# Patient Record
Sex: Male | Born: 1945 | Race: White | Hispanic: No | Marital: Married | Smoking: Former smoker
Health system: Southern US, Community
[De-identification: ages and names within clinical notes are randomized; demographics above are authoritative.]

## PROBLEM LIST (undated history)

## (undated) DIAGNOSIS — N400 Enlarged prostate without lower urinary tract symptoms: Secondary | ICD-10-CM

## (undated) DIAGNOSIS — I10 Essential (primary) hypertension: Secondary | ICD-10-CM

## (undated) DIAGNOSIS — J45909 Unspecified asthma, uncomplicated: Secondary | ICD-10-CM

## (undated) DIAGNOSIS — K5730 Diverticulosis of large intestine without perforation or abscess without bleeding: Secondary | ICD-10-CM

## (undated) DIAGNOSIS — I723 Aneurysm of iliac artery: Secondary | ICD-10-CM

## (undated) DIAGNOSIS — I714 Abdominal aortic aneurysm, without rupture, unspecified: Secondary | ICD-10-CM

## (undated) DIAGNOSIS — K219 Gastro-esophageal reflux disease without esophagitis: Secondary | ICD-10-CM

## (undated) HISTORY — DX: Abdominal aortic aneurysm, without rupture, unspecified: I71.40

## (undated) HISTORY — DX: Benign prostatic hyperplasia without lower urinary tract symptoms: N40.0

## (undated) HISTORY — DX: Essential (primary) hypertension: I10

## (undated) HISTORY — DX: Aneurysm of iliac artery: I72.3

## (undated) HISTORY — DX: Diverticulosis of large intestine without perforation or abscess without bleeding: K57.30

## (undated) HISTORY — DX: Gastro-esophageal reflux disease without esophagitis: K21.9

## (undated) HISTORY — DX: Unspecified asthma, uncomplicated: J45.909

## (undated) HISTORY — DX: Abdominal aortic aneurysm, without rupture: I71.4

---

## 2004-08-23 ENCOUNTER — Encounter: Admission: RE | Admit: 2004-08-23 | Discharge: 2004-08-23 | Payer: Self-pay | Admitting: Occupational Medicine

## 2006-02-13 ENCOUNTER — Encounter: Admission: RE | Admit: 2006-02-13 | Discharge: 2006-02-13 | Payer: Self-pay | Admitting: Gastroenterology

## 2007-03-08 ENCOUNTER — Emergency Department (HOSPITAL_COMMUNITY): Admission: EM | Admit: 2007-03-08 | Discharge: 2007-03-08 | Payer: Self-pay | Admitting: Emergency Medicine

## 2008-10-29 ENCOUNTER — Emergency Department (HOSPITAL_BASED_OUTPATIENT_CLINIC_OR_DEPARTMENT_OTHER): Admission: EM | Admit: 2008-10-29 | Discharge: 2008-10-29 | Payer: Self-pay | Admitting: Emergency Medicine

## 2008-10-29 ENCOUNTER — Ambulatory Visit: Payer: Self-pay | Admitting: Diagnostic Radiology

## 2009-09-28 IMAGING — CR DG CHEST 2V
2 series · 2 of 2 positions shown · non-contrast
Comparison: Chest radiograph 03/08/2007

CLINICAL DATA: Dizziness

CHEST - 2 VIEW

[w chest pa]
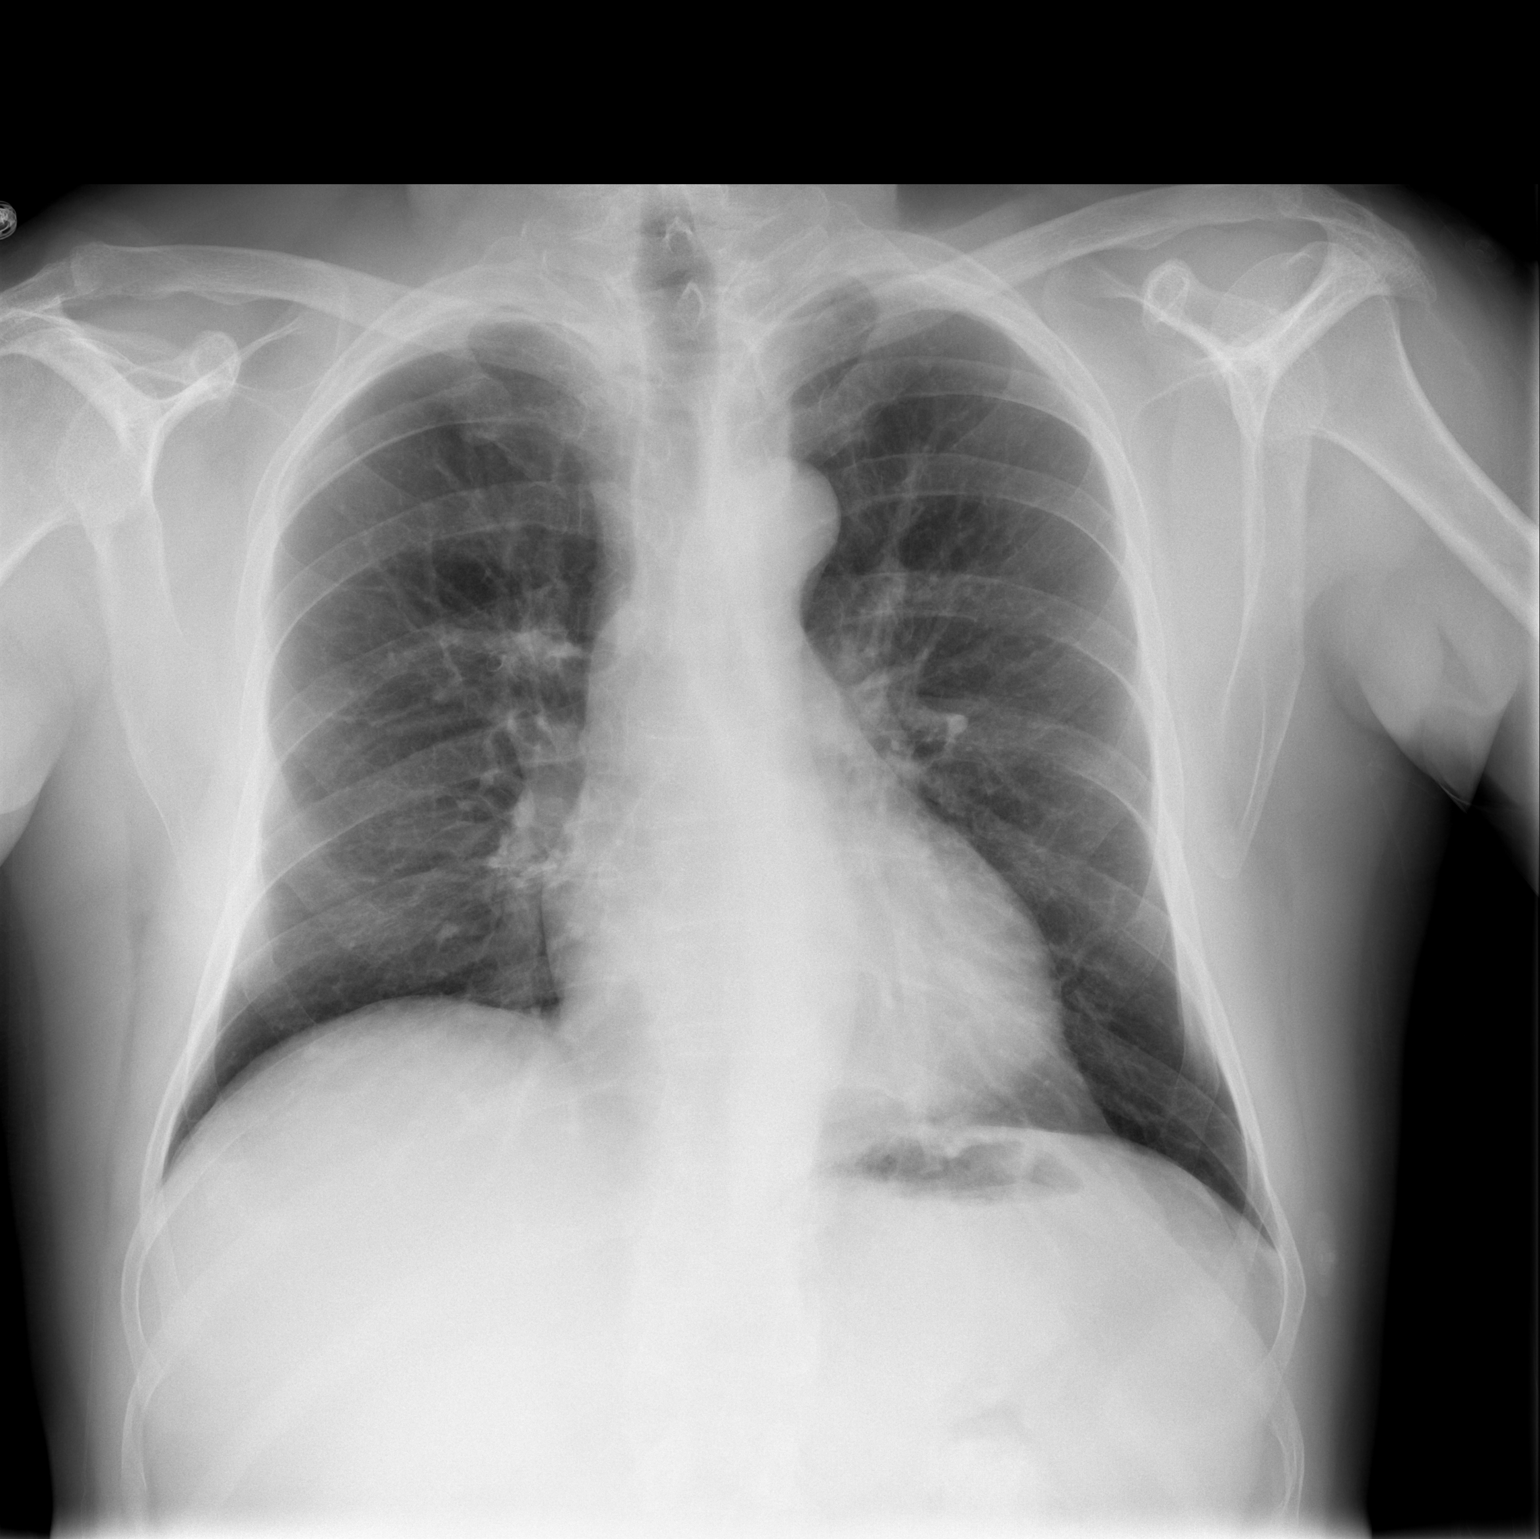

[w chest lat]
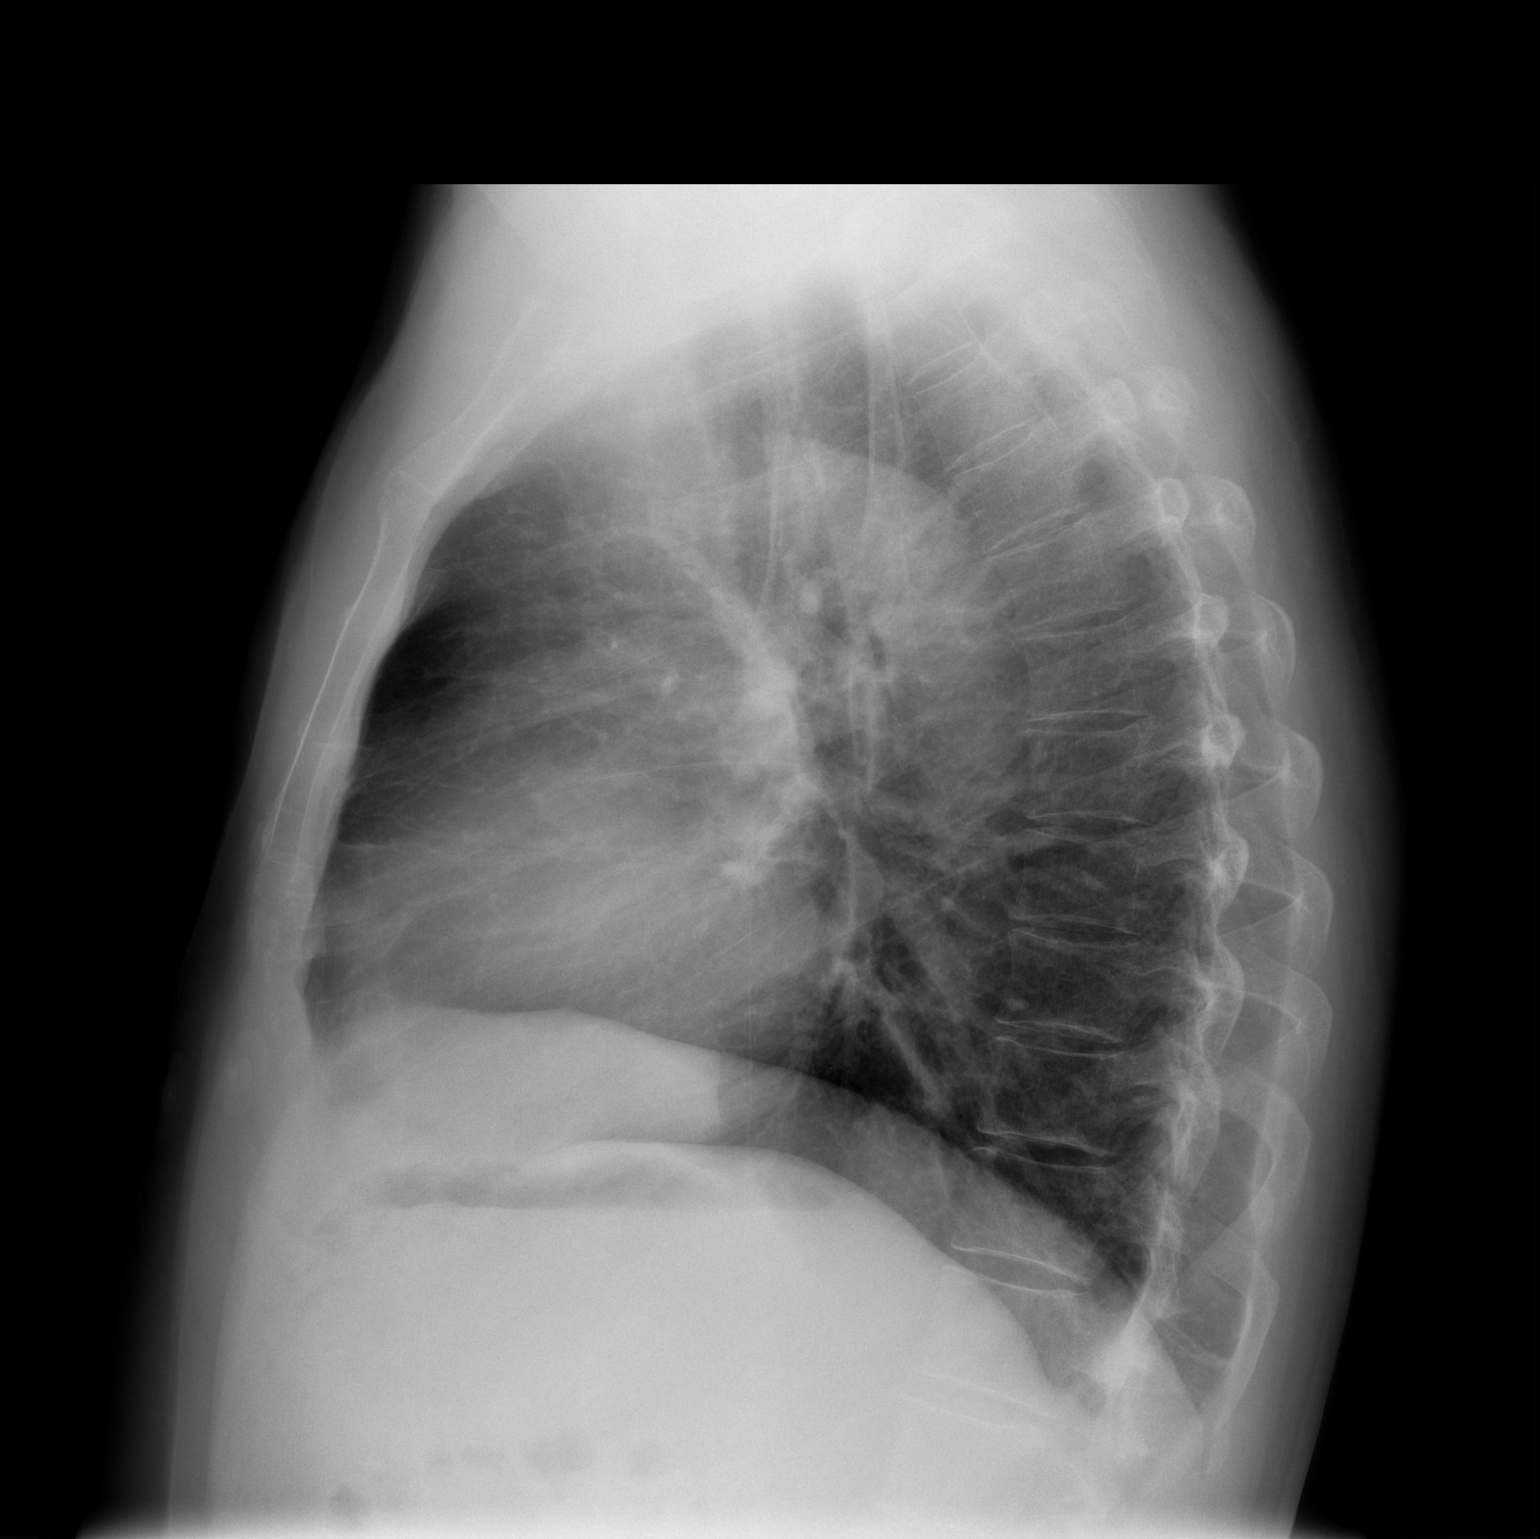

[2 of 2 positions shown; findings below may reference images not displayed]

FINDINGS: Normal mediastinum and cardiac silhouette.  Costophrenic
angles are clear.  No evidence of effusion, infiltrate, or
pneumothorax.
IMPRESSION: No acute cardiopulmonary process.

## 2010-12-24 LAB — DIFFERENTIAL
Basophils Absolute: 0 10*3/uL (ref 0.0–0.1)
Basophils Relative: 1 % (ref 0–1)
Eosinophils Absolute: 0.2 10*3/uL (ref 0.0–0.7)
Eosinophils Relative: 3 % (ref 0–5)
Lymphocytes Relative: 14 % (ref 12–46)
Lymphs Abs: 1.1 10*3/uL (ref 0.7–4.0)
Monocytes Absolute: 0.5 10*3/uL (ref 0.1–1.0)
Monocytes Relative: 6 % (ref 3–12)
Neutro Abs: 6 10*3/uL (ref 1.7–7.7)
Neutrophils Relative %: 77 % (ref 43–77)

## 2010-12-24 LAB — POCT TOXICOLOGY PANEL

## 2010-12-24 LAB — URINALYSIS, ROUTINE W REFLEX MICROSCOPIC
Bilirubin Urine: NEGATIVE
Glucose, UA: NEGATIVE mg/dL
Hgb urine dipstick: NEGATIVE
Ketones, ur: NEGATIVE mg/dL
Nitrite: NEGATIVE
Protein, ur: NEGATIVE mg/dL
Specific Gravity, Urine: 1.025 (ref 1.005–1.030)
Urobilinogen, UA: 1 mg/dL (ref 0.0–1.0)
pH: 5.5 (ref 5.0–8.0)

## 2010-12-24 LAB — POCT CARDIAC MARKERS
CKMB, poc: 1 ng/mL (ref 1.0–8.0)
Myoglobin, poc: 65.7 ng/mL (ref 12–200)
Troponin i, poc: <0.05 ng/mL (ref 0.00–0.09)

## 2010-12-24 LAB — CBC
HCT: 45.8 % (ref 39.0–52.0)
Hemoglobin: 14.9 g/dL (ref 13.0–17.0)
MCHC: 32.6 g/dL (ref 30.0–36.0)
MCV: 85.2 fL (ref 78.0–100.0)
Platelets: 178 10*3/uL (ref 150–400)
RBC: 5.38 MIL/uL (ref 4.22–5.81)
RDW: 11.9 % (ref 11.5–15.5)
WBC: 7.8 10*3/uL (ref 4.0–10.5)

## 2010-12-24 LAB — COMPREHENSIVE METABOLIC PANEL
ALT: 45 U/L (ref 0–53)
AST: 32 U/L (ref 0–37)
Albumin: 4.3 g/dL (ref 3.5–5.2)
Alkaline Phosphatase: 95 U/L (ref 39–117)
BUN: 20 mg/dL (ref 6–23)
CO2: 25 mEq/L (ref 19–32)
Calcium: 9 mg/dL (ref 8.4–10.5)
Chloride: 107 mEq/L (ref 96–112)
Creatinine, Ser: 1 mg/dL (ref 0.4–1.5)
GFR calc Af Amer: >60 mL/min (ref >60)
GFR calc non Af Amer: >60 mL/min (ref >60)
Glucose, Bld: 143 mg/dL — ABNORMAL HIGH (ref 70–99)
Potassium: 3.8 mEq/L (ref 3.5–5.1)
Sodium: 142 mEq/L (ref 135–145)
Total Bilirubin: 1.3 mg/dL — ABNORMAL HIGH (ref 0.3–1.2)
Total Protein: 7.3 g/dL (ref 6.0–8.3)

## 2011-06-25 LAB — I-STAT 8, (EC8 V) (CONVERTED LAB)
Acid-base deficit: 3 — ABNORMAL HIGH
BUN: 11
Bicarbonate: 22.6
Chloride: 107
Glucose, Bld: 128 — ABNORMAL HIGH
HCT: 45
Hemoglobin: 15.3
Operator id: 288331
Potassium: 3.7
Sodium: 138
TCO2: 24
pCO2, Ven: 43.3 — ABNORMAL LOW
pH, Ven: 7.327 — ABNORMAL HIGH

## 2011-06-25 LAB — COMPREHENSIVE METABOLIC PANEL
ALT: 22
AST: 19
Albumin: 3.7
Alkaline Phosphatase: 75
BUN: 11
CO2: 24
Calcium: 9.1
Chloride: 108
Creatinine, Ser: 0.7
GFR calc Af Amer: >60
GFR calc non Af Amer: >60
Glucose, Bld: 134 — ABNORMAL HIGH
Potassium: 3.7
Sodium: 138
Total Bilirubin: 1.4 — ABNORMAL HIGH
Total Protein: 6.7

## 2011-06-25 LAB — URINALYSIS, ROUTINE W REFLEX MICROSCOPIC
Bilirubin Urine: NEGATIVE
Glucose, UA: NEGATIVE
Hgb urine dipstick: NEGATIVE
Ketones, ur: NEGATIVE
Nitrite: NEGATIVE
Protein, ur: NEGATIVE
Specific Gravity, Urine: >1.03 — ABNORMAL HIGH
Urobilinogen, UA: 0.2
pH: 6

## 2011-06-25 LAB — LIPASE, BLOOD: Lipase: 28

## 2011-06-25 LAB — POCT CARDIAC MARKERS
CKMB, poc: 1.1
CKMB, poc: 1.1
CKMB, poc: <1 — ABNORMAL LOW
Myoglobin, poc: 49.1
Myoglobin, poc: 76.3
Myoglobin, poc: 81.6
Operator id: 151321
Operator id: 151321
Operator id: 288331
Troponin i, poc: <0.05
Troponin i, poc: <0.05
Troponin i, poc: <0.05

## 2011-06-25 LAB — CBC
HCT: 43.9
Hemoglobin: 15
MCHC: 34.1
MCV: 85
Platelets: 213
RBC: 5.17
RDW: 12.7
WBC: 7.3

## 2011-06-25 LAB — DIFFERENTIAL
Basophils Absolute: 0
Basophils Relative: 0
Eosinophils Absolute: 0.2
Eosinophils Relative: 3
Lymphocytes Relative: 22
Lymphs Abs: 1.6
Monocytes Absolute: 0.5
Monocytes Relative: 7
Neutro Abs: 4.9
Neutrophils Relative %: 68

## 2013-03-30 ENCOUNTER — Other Ambulatory Visit: Payer: Self-pay | Admitting: *Deleted

## 2013-03-30 ENCOUNTER — Encounter: Payer: Self-pay | Admitting: Vascular Surgery

## 2013-04-26 ENCOUNTER — Encounter: Payer: Self-pay | Admitting: Vascular Surgery

## 2013-04-27 ENCOUNTER — Ambulatory Visit (INDEPENDENT_AMBULATORY_CARE_PROVIDER_SITE_OTHER): Payer: Medicare Other | Admitting: Vascular Surgery

## 2013-04-27 ENCOUNTER — Encounter: Payer: Self-pay | Admitting: Vascular Surgery

## 2013-04-27 VITALS — BP 137/89 | HR 60 | Resp 16 | Ht 67.0 in | Wt 176.7 lb

## 2013-04-27 DIAGNOSIS — I723 Aneurysm of iliac artery: Secondary | ICD-10-CM

## 2013-04-27 NOTE — Progress Notes (Signed)
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 Referred by Dr. Margarita Grizzle.  HPI: Wesley Navarro is a 67 y.o. male who is being worked up for hematuria. This prompted a CT scan of the abdomen and pelvis. 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 He was sent for vascular consultation. He states that he has had no further hematuria. Workup was unremarkable. He denies any history of abdominal pain or back pain. There is no family history of aneurysmal disease except that his sister had an intracranial aneurysm.  Past Medical History  Diagnosis Date  . Asthma   . GERD (gastroesophageal reflux disease)   . Hypertension   . Iliac artery aneurysm   . Sigmoid diverticulosis   . BPH (benign prostatic hypertrophy)    Family History  Problem Relation Age of Onset  . Cancer Mother     malignant melanoma  . Diabetes Mother   . Hypertension Mother   . Heart disease Father   . Hypertension Father   . Other Father     varicose veins  . Heart attack Father   . Hypertension Sister    There is no family history of premature cardiovascular disease.  SOCIAL HISTORY: History  Substance Use Topics  . Smoking status: Former Smoker    Types: Cigarettes    Quit date: 04/27/1978  . Smokeless tobacco: Never Used  . Alcohol Use: No   No Known Allergies  Current Outpatient Prescriptions  Medication Sig Dispense Refill  . aspirin 81 MG tablet Take 81 mg by mouth daily.      . hydrochlorothiazide (HYDRODIURIL) 25 MG tablet Take 25 mg by mouth daily.      Marland Kitchen lisinopril (PRINIVIL,ZESTRIL) 10 MG tablet Take 10 mg by mouth daily.      Marland Kitchen lisinopril (PRINIVIL,ZESTRIL) 20 MG tablet Take 20 mg by mouth daily.      Marland Kitchen omeprazole (PRILOSEC) 20 MG capsule Take 20 mg by mouth daily.       No current facility-administered medications for this visit.    REVIEW OF SYSTEMS: Arly.Keller ] denotes positive finding; [  ] denotes negative finding  CARDIOVASCULAR:  [ ]  chest pain   [ ]  chest pressure   [ ]  palpitations   [ ]  orthopnea   [ ]  dyspnea on exertion   [ ]  claudication   [ ]  rest pain   [ ]  DVT   [ ]  phlebitis PULMONARY:   [ ]  productive cough   Arly.Keller ] asthma   [ ]  wheezing NEUROLOGIC:   [ ]  weakness  [ ]  paresthesias  [ ]  aphasia  [ ]  amaurosis  [ ]  dizziness HEMATOLOGIC:   [ ]  bleeding problems   [ ]  clotting disorders MUSCULOSKELETAL:  [ ]  joint pain   [ ]  joint swelling [ ]  leg swelling GASTROINTESTINAL: [ ]   blood in stool  [ ]   hematemesis GENITOURINARY:  [ ]   dysuria  [ ]   hematuria PSYCHIATRIC:  [ ]  history of major depression INTEGUMENTARY:  [ ]  rashes  [ ]  ulcers CONSTITUTIONAL:  [ ]  fever   [ ]  chills  PHYSICAL EXAM: Filed Vitals:   04/27/13 0957  BP: 137/89  Pulse: 60  Resp: 16  Height: 5\' 7"  (1.702 m)  Weight: 176 lb 11.2 oz (80.151 kg)  SpO2: 96%   Body mass index is 27.67  kg/(m^2). GENERAL: The patient is a well-nourished male, in no acute distress. The vital signs are documented above. CARDIOVASCULAR: There is a regular rate and rhythm. I do not detect carotid bruits. He has palpable femoral, popliteal, dorsalis pedis, and posterior tibial pulses bilaterally. He has no significant lower extremity swelling. PULMONARY: There is good air exchange bilaterally without wheezing or rales. ABDOMEN: Soft and non-tender with normal pitched bowel sounds. I do not palpate an abdominal aortic aneurysm. MUSCULOSKELETAL: There are no major deformities or cyanosis. NEUROLOGIC: No focal weakness or paresthesias are detected. SKIN: There are no ulcers or rashes noted. PSYCHIATRIC: The patient has a normal affect.  DATA:  I have reviewed his records from Dr. Hilario Quarry office. His CT scan showed some aortoiliac atherosclerotic disease with a fusiform aneurysm of the right common iliac artery with laminated thrombus.  His cystoscopy to  workup his hematuria was unremarkable.  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 This is asymptomatic. I explained we would not consider elective repair and lysis enlarged to likely greater than 3.5 cm. This is small enough that I think we can follow this at a yearly intervals for now. I've ordered a follow up ultrasound of his aorta and iliac arteries in 1 year and I will see him back at that time. He knows to call sooner if he has problems. Fortunately he is not a smoker. His blood pressure. Again a reasonable control.  Adiva Boettner S [facility] [place] [facility]: [phone]

## 2013-04-27 NOTE — Addendum Note (Signed)
Addended by: Adria Dill L on: 04/27/2013 03:44 PM   Modules accepted: Orders

## 2013-04-28 ENCOUNTER — Encounter: Payer: Self-pay | Admitting: Urology

## 2013-10-27 ENCOUNTER — Other Ambulatory Visit: Payer: Self-pay | Admitting: Vascular Surgery

## 2013-10-27 DIAGNOSIS — I724 Aneurysm of artery of lower extremity: Secondary | ICD-10-CM

## 2014-04-19 ENCOUNTER — Other Ambulatory Visit: Payer: Self-pay | Admitting: *Deleted

## 2014-04-19 DIAGNOSIS — I739 Peripheral vascular disease, unspecified: Secondary | ICD-10-CM

## 2014-05-02 ENCOUNTER — Encounter: Payer: Self-pay | Admitting: Vascular Surgery

## 2014-05-03 ENCOUNTER — Ambulatory Visit (HOSPITAL_COMMUNITY)
Admission: RE | Admit: 2014-05-03 | Discharge: 2014-05-03 | Disposition: A | Discharge status: Home / Self Care | Payer: Medicare Other | Source: Ambulatory Visit | Attending: Vascular Surgery | Admitting: Vascular Surgery

## 2014-05-03 ENCOUNTER — Encounter: Payer: Self-pay | Admitting: Vascular Surgery

## 2014-05-03 ENCOUNTER — Other Ambulatory Visit: Payer: Self-pay | Admitting: Vascular Surgery

## 2014-05-03 ENCOUNTER — Ambulatory Visit (INDEPENDENT_AMBULATORY_CARE_PROVIDER_SITE_OTHER): Payer: Medicare Other | Admitting: Vascular Surgery

## 2014-05-03 VITALS — BP 122/87 | HR 57 | Ht 67.0 in | Wt 177.6 lb

## 2014-05-03 DIAGNOSIS — I739 Peripheral vascular disease, unspecified: Secondary | ICD-10-CM | POA: Diagnosis not present

## 2014-05-03 DIAGNOSIS — I723 Aneurysm of iliac artery: Secondary | ICD-10-CM | POA: Diagnosis present

## 2014-05-03 DIAGNOSIS — I724 Aneurysm of artery of lower extremity: Secondary | ICD-10-CM | POA: Insufficient documentation

## 2014-05-03 NOTE — Progress Notes (Signed)
Vascular and Vein Specialist of [facility]  Patient name: Wesley Navarro MRN: [id number] DOB: [birth year] Sex: male  REASON FOR VISIT: Follow up of right common iliac artery aneurysm.  HPI: Wesley Navarro is a 68 y.o. male who I last saw on 04/27/13. At that time, by CT scan, the aneurysm measured 2 cm in maximum diameter. Since I saw him last, he denies any history of abdominal pain or back pain. There have been no significant changes in his medical history. He remains quite active.  Past Medical History  Diagnosis Date  . Asthma   . GERD (gastroesophageal reflux disease)   . Hypertension   . Iliac artery aneurysm   . Sigmoid diverticulosis   . BPH (benign prostatic hypertrophy)    Family History  Problem Relation Age of Onset  . Cancer Mother     malignant melanoma  . Diabetes Mother   . Hypertension Mother   . Heart disease Father   . Hypertension Father   . Other Father     varicose veins  . Heart attack Father   . Hypertension Sister    SOCIAL HISTORY: History  Substance Use Topics  . Smoking status: Former Smoker    Types: Cigarettes    Quit date: 04/27/1978  . Smokeless tobacco: Never Used  . Alcohol Use: No   No Known Allergies Current Outpatient Prescriptions  Medication Sig Dispense Refill  . aspirin 81 MG tablet Take 81 mg by mouth daily.      . hydrochlorothiazide (HYDRODIURIL) 25 MG tablet Take 25 mg by mouth daily.      Marland Kitchen lisinopril (PRINIVIL,ZESTRIL) 10 MG tablet Take 10 mg by mouth daily.      Marland Kitchen lisinopril (PRINIVIL,ZESTRIL) 20 MG tablet Take 20 mg by mouth daily.      Marland Kitchen omeprazole (PRILOSEC) 20 MG capsule Take 20 mg by mouth daily.       No current facility-administered medications for this visit.   REVIEW OF SYSTEMS: Arly.Keller ] denotes positive finding; [  ] denotes negative finding  CARDIOVASCULAR:   chest pain    chest pressure    palpitations    orthopnea    dyspnea on exertion    claudication    rest pain    DVT     phlebitis PULMONARY:    productive cough    asthma    wheezing NEUROLOGIC:    weakness   paresthesias   aphasia   amaurosis   dizziness HEMATOLOGIC:    bleeding problems    clotting disorders MUSCULOSKELETAL:   joint pain    joint swelling  leg swelling GASTROINTESTINAL:   blood in stool    hematemesis GENITOURINARY:    dysuria    hematuria PSYCHIATRIC:   history of major depression INTEGUMENTARY:   rashes   ulcers CONSTITUTIONAL:   fever    chills  PHYSICAL EXAM: Filed Vitals:   05/03/14 1022  BP: 122/87  Pulse: 57  Height:  (1.702 m)  Weight: 177 lb 9.6 oz (80.559 kg)  SpO2: 100%   Body mass index is 27.81 kg/(m^2). GENERAL: The patient is a well-nourished male, in no acute distress. The vital signs are documented above. CARDIOVASCULAR: There is a regular rate and rhythm. I do not detect carotid bruits. He has palpable femoral, popliteal, dorsalis pedis, and  posterior tibial pulses bilaterally. PULMONARY: There is good air exchange bilaterally without wheezing or rales. ABDOMEN: Soft and non-tender with normal pitched bowel sounds. I do not palpate an abdominal aortic aneurysm. MUSCULOSKELETAL: There are no major deformities or cyanosis. NEUROLOGIC: No focal weakness or paresthesias are detected. SKIN: There are no ulcers or rashes noted. PSYCHIATRIC: The patient has a normal affect.  DATA:  I have independently interpreted his duplex today. This shows that the maximum diameter of his right common iliac artery is 1.6 cm. The left common iliac artery measures 1.1 cm. The maximum diameter of the aorta is 2.55 cm.  MEDICAL ISSUES:  Iliac artery aneurysm His right common iliac artery aneurysm has remained stable in size and actually appears slightly smaller compared to the study one year ago. It currently measures 1.6 cm in maximum diameter by duplex. Typically wouldn't consider elective repair if it reached 3.5  cm in maximum diameter. I have ordered a fall duplex scan in 1 year and I will see him back at that time. If it continues to remain stable we may be able to stretch his follow up out to 18 months. Fortunately he is not a smoker.    Return in about 1 year (around 05/04/2015).  Inaara Tye S [facility] of [place] [facility]: [phone]

## 2014-05-03 NOTE — Assessment & Plan Note (Signed)
His right common iliac artery aneurysm has remained stable in size and actually appears slightly smaller compared to the study one year ago. It currently measures 1.6 cm in maximum diameter by duplex. Typically wouldn't consider elective repair if it reached 3.5 cm in maximum diameter. I have ordered a fall duplex scan in 1 year and I will see him back at that time. If it continues to remain stable we may be able to stretch his follow up out to 18 months. Fortunately he is not a smoker.

## 2014-05-03 NOTE — Addendum Note (Signed)
Addended by: Sharee Pimple on: 05/03/2014 12:24 PM   Modules accepted: Orders

## 2015-05-08 ENCOUNTER — Encounter: Payer: Self-pay | Admitting: Vascular Surgery

## 2015-05-09 ENCOUNTER — Ambulatory Visit (HOSPITAL_COMMUNITY)
Admission: RE | Admit: 2015-05-09 | Discharge: 2015-05-09 | Disposition: A | Discharge status: Home / Self Care | Payer: Medicare Other | Source: Ambulatory Visit | Attending: Vascular Surgery | Admitting: Vascular Surgery

## 2015-05-09 ENCOUNTER — Encounter: Payer: Self-pay | Admitting: Vascular Surgery

## 2015-05-09 ENCOUNTER — Ambulatory Visit (INDEPENDENT_AMBULATORY_CARE_PROVIDER_SITE_OTHER): Payer: Medicare Other | Admitting: Vascular Surgery

## 2015-05-09 VITALS — BP 127/76 | HR 55 | Temp 97.9°F | Resp 16 | Ht 67.0 in | Wt 171.0 lb

## 2015-05-09 DIAGNOSIS — I714 Abdominal aortic aneurysm, without rupture, unspecified: Secondary | ICD-10-CM

## 2015-05-09 DIAGNOSIS — I723 Aneurysm of iliac artery: Secondary | ICD-10-CM | POA: Insufficient documentation

## 2015-05-09 NOTE — Addendum Note (Signed)
Addended by: Adria Dill L on: 05/09/2015 10:41 AM   Modules accepted: Orders

## 2015-05-09 NOTE — Progress Notes (Signed)
Vascular and Vein Specialist of [facility]  Patient name: Wesley Navarro MRN: [id number] DOB: [birth year] Sex: male  REASON FOR VISIT: Follow up of right common iliac artery aneurysm  HPI: Wesley Navarro is a 69 y.o. male who I have been following with a right common iliac artery aneurysm. I last saw him on 05/03/2014. At that time the maximum diameter of the right common iliac artery aneurysm was 1.6 cm. The left common iliac artery measured 1.1 cm. The infrarenal aorta measured 2.55 cm.  Since I saw him last, he denies any history of abdominal pain or back pain. There has been no significant change in his medical history. His blood pressure is well controlled. He has not smoked in many years.  Past Medical History  Diagnosis Date  . Asthma   . GERD (gastroesophageal reflux disease)   . Hypertension   . Iliac artery aneurysm   . Sigmoid diverticulosis   . BPH (benign prostatic hypertrophy)   . AAA (abdominal aortic aneurysm)   . Iliac aneurysm    Family History  Problem Relation Age of Onset  . Cancer Mother     malignant melanoma  . Diabetes Mother   . Hypertension Mother   . Heart disease Father   . Hypertension Father   . Other Father     varicose veins  . Heart attack Father   . Hypertension Sister    SOCIAL HISTORY: Social History  Substance Use Topics  . Smoking status: Former Smoker    Types: Cigarettes    Quit date: 04/27/1978  . Smokeless tobacco: Never Used  . Alcohol Use: No   No Known Allergies Current Outpatient Prescriptions  Medication Sig Dispense Refill  . aspirin 81 MG tablet Take 81 mg by mouth daily.    . hydrochlorothiazide (HYDRODIURIL) 25 MG tablet Take 25 mg by mouth daily.    Marland Kitchen lisinopril (PRINIVIL,ZESTRIL) 10 MG tablet Take 10 mg by mouth daily.    Marland Kitchen lisinopril (PRINIVIL,ZESTRIL) 20 MG tablet Take 20 mg by mouth daily.    Marland Kitchen omeprazole (PRILOSEC) 20 MG capsule Take 20 mg by mouth daily.     No current facility-administered  medications for this visit.   REVIEW OF SYSTEMS: Arly.Keller ] denotes positive finding; [  ] denotes negative finding  CARDIOVASCULAR:   chest pain    chest pressure    palpitations    orthopnea    dyspnea on exertion    claudication    rest pain    DVT    phlebitis PULMONARY:    productive cough    asthma    wheezing NEUROLOGIC:    weakness   paresthesias   aphasia   amaurosis   dizziness HEMATOLOGIC:    bleeding problems    clotting disorders MUSCULOSKELETAL:   joint pain    joint swelling  leg swelling GASTROINTESTINAL:   blood in stool    hematemesis GENITOURINARY:    dysuria    hematuria PSYCHIATRIC:   history of major depression INTEGUMENTARY:   rashes   ulcers CONSTITUTIONAL:   fever    chills  PHYSICAL EXAM: Filed Vitals:   05/09/15 0929  BP: 127/76  Pulse: 55  Temp: 97.9 F (36.6 C)  TempSrc: Oral  Resp: 16  Height:  (1.702 m)  Weight: 171 lb (77.565  kg)  SpO2: 100%   GENERAL: The patient is a well-nourished male, in no acute distress. The vital signs are documented above. CARDIAC: There is a regular rate and rhythm.  VASCULAR: I do not detect carotid bruits. He has bilateral femoral, popliteal, dorsalis pedis, and posterior tibial pulses. PULMONARY: There is good air exchange bilaterally without wheezing or rales. ABDOMEN: Soft and non-tender with normal pitched bowel sounds.  MUSCULOSKELETAL: There are no major deformities or cyanosis. NEUROLOGIC: No focal weakness or paresthesias are detected. SKIN: There are no ulcers or rashes noted. PSYCHIATRIC: The patient has a normal affect.  DATA:  ABDOMINAL AORTIC DUPLEX: I have independently interpreted his duplex of the abdominal aorta and iliac arteries. The maximum diameter of the right common iliac artery aneurysm is 1.7 cm. The maximum diameter of the left common iliac artery is 1.3 cm. The infrarenal aorta is 2.9 cm in maximum  diameter. These measurements of increased only slightly over the last year.  MEDICAL ISSUES:  RIGHT COMMON ILIAC ARTERY ANEURYSM: There has been no significant change in the size of the right common iliac artery aneurysm which is fairly small. He is asymptomatic. His blood pressure is well controlled. He is not a smoker. At this point I think it is safe to stretch his follow up out to 18 months. I have ordered a follow up duplex scan 18 months and I'll see him back at that time. He knows to call sooner if he has problems.  Return in about 1 year (around 05/08/2016).   Waverly Ferrari [facility] of [place] [facility]: [phone]

## 2015-10-16 ENCOUNTER — Other Ambulatory Visit: Payer: Self-pay | Admitting: *Deleted

## 2015-10-16 ENCOUNTER — Telehealth: Payer: Self-pay | Admitting: Vascular Surgery

## 2015-10-16 DIAGNOSIS — M79605 Pain in left leg: Secondary | ICD-10-CM

## 2015-10-16 DIAGNOSIS — M79604 Pain in right leg: Secondary | ICD-10-CM

## 2015-10-16 NOTE — Progress Notes (Signed)
Called patient back regarding his BLE pain. He describes this pain as cramping in both legs especially in the right calf when walking. Over the past month this pain is throbbing constantly and patient would like to make an appt asap.He has had no injury, fever or erythema.  Olegario Messier will contact patient and set up ABIs, BLE arterials and an office visit with either Dr. Edilia Bo or the NP.Patient voiced aggreement with this plan.

## 2015-10-16 NOTE — Telephone Encounter (Signed)
Pt's calling in per him the third time. Per pt he is having right back pain leg. He goes to bed sore and wake up feeling the same.  The Best contact number to call patient is [phone].. Thanks AD

## 2015-10-18 ENCOUNTER — Ambulatory Visit (HOSPITAL_COMMUNITY)
Admission: RE | Admit: 2015-10-18 | Discharge: 2015-10-18 | Disposition: A | Discharge status: Home / Self Care | Payer: Medicare Other | Source: Ambulatory Visit | Attending: Vascular Surgery | Admitting: Vascular Surgery

## 2015-10-18 ENCOUNTER — Telehealth: Payer: Self-pay | Admitting: Vascular Surgery

## 2015-10-18 ENCOUNTER — Other Ambulatory Visit: Payer: Self-pay | Admitting: Vascular Surgery

## 2015-10-18 ENCOUNTER — Ambulatory Visit (HOSPITAL_COMMUNITY)
Admission: RE | Admit: 2015-10-18 | Discharge: 2015-10-18 | Disposition: A | Discharge status: Home / Self Care | Payer: Medicare Other | Source: Ambulatory Visit

## 2015-10-18 DIAGNOSIS — M79604 Pain in right leg: Secondary | ICD-10-CM | POA: Diagnosis present

## 2015-10-18 DIAGNOSIS — I10 Essential (primary) hypertension: Secondary | ICD-10-CM | POA: Diagnosis not present

## 2015-10-18 DIAGNOSIS — M79605 Pain in left leg: Secondary | ICD-10-CM | POA: Insufficient documentation

## 2015-10-18 NOTE — Telephone Encounter (Signed)
Patient was in today for an appointment with the lab. He is requesting pain medication. He doesn't have his follow up with [facility] until 10/31/2015. Please call on cell # to discuss possible pain medication refill.  Annabelle Harman

## 2015-10-19 ENCOUNTER — Telehealth: Payer: Self-pay | Admitting: *Deleted

## 2015-10-19 NOTE — Telephone Encounter (Signed)
Returned call to patient.  He had requested pain medication at his ultrasound appointment 10/18/15.  I explained that since he had not been evaluated by Dr Edilia Bo since 05/09/15 that we could not prescribe medication for him that he should follow up with his PCP or Urgent Care.  He voiced understanding of these instructions.

## 2015-10-23 ENCOUNTER — Telehealth: Payer: Self-pay

## 2015-10-23 ENCOUNTER — Telehealth: Payer: Self-pay | Admitting: *Deleted

## 2015-10-23 NOTE — Telephone Encounter (Signed)
Phone call from pt.  C/o right leg pain.  Stated it hurts all the time.  Stated this has been going on approx. 1 month.  Denied any swelling in right lower extremity.  Denied any open sores.  Stated he has pain at rest and with walking.  Stated the pain back of right leg, just below the knee.  Advised pain medication can not be given until he is evaluated.  Requested to move appt. Forward, from 2/22.  Appt. Given 10/26/15 @ 8:30 AM.  Agreed with plan.

## 2015-10-23 NOTE — Telephone Encounter (Signed)
LMTCB- pt called in to triage requesting pain medications or to move up his 10-31-15 appt with Dr. Edilia Bo. Patient c/o increase claudication pain; we last saw 04-2015 for iliac aneurysms.

## 2015-10-24 ENCOUNTER — Encounter: Payer: Self-pay | Admitting: Vascular Surgery

## 2015-10-26 ENCOUNTER — Encounter: Payer: Self-pay | Admitting: Vascular Surgery

## 2015-10-26 ENCOUNTER — Ambulatory Visit (HOSPITAL_COMMUNITY)
Admission: RE | Admit: 2015-10-26 | Discharge: 2015-10-26 | Disposition: A | Discharge status: Home / Self Care | Payer: Medicare Other | Source: Ambulatory Visit | Attending: Vascular Surgery | Admitting: Vascular Surgery

## 2015-10-26 ENCOUNTER — Ambulatory Visit (INDEPENDENT_AMBULATORY_CARE_PROVIDER_SITE_OTHER): Payer: Medicare Other | Admitting: Vascular Surgery

## 2015-10-26 VITALS — BP 136/81 | HR 64 | Temp 98.7°F | Ht 67.0 in | Wt 181.0 lb

## 2015-10-26 DIAGNOSIS — I8391 Asymptomatic varicose veins of right lower extremity: Secondary | ICD-10-CM | POA: Insufficient documentation

## 2015-10-26 DIAGNOSIS — M79604 Pain in right leg: Secondary | ICD-10-CM | POA: Diagnosis not present

## 2015-10-26 DIAGNOSIS — I723 Aneurysm of iliac artery: Secondary | ICD-10-CM

## 2015-10-26 DIAGNOSIS — I10 Essential (primary) hypertension: Secondary | ICD-10-CM | POA: Insufficient documentation

## 2015-10-26 DIAGNOSIS — M7989 Other specified soft tissue disorders: Secondary | ICD-10-CM | POA: Diagnosis not present

## 2015-10-26 NOTE — Progress Notes (Signed)
Vascular and Vein Specialist of [facility]  Patient name: Wesley Navarro MRN: [id number] DOB: September 16, 1945 Sex: male  REASON FOR VISIT: Follow up of right common iliac artery aneurysm.  HPI: Wesley Navarro is a 70 y.o. male who I last saw on 05/09/2015.  At that time, by duplex, the right common iliac artery measured 1.7 cm in maximum diameter. The left common iliac artery measured 1.3 cm in maximum diameter. The infrarenal aorta measured 2.9 cm in maximum diameter. I planned on seeing him back in a year. He presents today because he's had the gradual onset of right calf pain or proximally a month ago. This has progressively gotten worse. He denies any significant swelling. An eyes knee pain or history of arthritis. He denies any injury to the leg. I do not get a clear history of claudication. He denies rest pain or nonhealing ulcers. He denies any discoloration of his toes.  He is on aspirin. He is not a smoker. He denies any recent long travel.  Past Medical History  Diagnosis Date  . Asthma   . GERD (gastroesophageal reflux disease)   . Hypertension   . Iliac artery aneurysm (HCC)   . Sigmoid diverticulosis   . BPH (benign prostatic hypertrophy)   . AAA (abdominal aortic aneurysm) (HCC)   . Iliac aneurysm (HCC)     Family History  Problem Relation Age of Onset  . Cancer Mother     malignant melanoma  . Diabetes Mother   . Hypertension Mother   . Heart disease Father   . Hypertension Father   . Other Father     varicose veins  . Heart attack Father   . Hypertension Sister     SOCIAL HISTORY: Social History  Substance Use Topics  . Smoking status: Former Smoker    Types: Cigarettes    Quit date: 04/27/1978  . Smokeless tobacco: Never Used  . Alcohol Use: No    No Known Allergies  Current Outpatient Prescriptions  Medication Sig Dispense Refill  . aspirin 81 MG tablet Take 81 mg by mouth daily.    . hydrochlorothiazide (HYDRODIURIL) 25 MG tablet Take 25 mg by  mouth daily.    Marland Kitchen lisinopril (PRINIVIL,ZESTRIL) 10 MG tablet Take 10 mg by mouth daily.    Marland Kitchen lisinopril (PRINIVIL,ZESTRIL) 20 MG tablet Take 20 mg by mouth daily.    Marland Kitchen omeprazole (PRILOSEC) 20 MG capsule Take 20 mg by mouth daily.     No current facility-administered medications for this visit.    REVIEW OF SYSTEMS:   denotes positive finding,  denotes negative finding Cardiac  Comments:  Chest pain or chest pressure:    Shortness of breath upon exertion:    Short of breath when lying flat:    Irregular heart rhythm:        Vascular    Pain in calf, thigh, or hip brought on by ambulation:    Pain in feet at night that wakes you up from your sleep:     Blood clot in your veins:    Leg swelling:         Pulmonary    Oxygen at home:    Productive cough:     Wheezing:         Neurologic    Sudden weakness in arms or legs:     Sudden numbness in arms or legs:     Sudden onset of difficulty speaking or slurred speech:    Temporary loss of  vision in one eye:     Problems with dizziness:         Gastrointestinal    Blood in stool:     Vomited blood:         Genitourinary    Burning when urinating:     Blood in urine:        Psychiatric    Major depression:         Hematologic    Bleeding problems:    Problems with blood clotting too easily:        Skin    Rashes or ulcers:        Constitutional    Fever or chills:      PHYSICAL EXAM: Filed Vitals:   10/26/15 0826  Height:  (1.702 m)  Weight: 181 lb (82.101 kg)    GENERAL: The patient is a well-nourished male, in no acute distress. The vital signs are documented above. CARDIAC: There is a regular rate and rhythm.  VASCULAR: do not detect carotid bruits. He has palpable femoral and pedal pulses bilaterally. He has no significant lower extremity swelling. PULMONARY: There is good air exchange bilaterally without wheezing or rales. ABDOMEN: Soft and non-tender with normal pitched bowel sounds.    MUSCULOSKELETAL: There are no major deformities or cyanosis. NEUROLOGIC: No focal weakness or paresthesias are detected. SKIN: There are no ulcers or rashes noted. PSYCHIATRIC: The patient has a normal affect.  DATA:   LOWER EXTREMITY ARTERIAL DOPPLER STUDY: I have reviewed his lower extremity arterial Doppler study that was performed on 10/18/2015. This showed triphasic Doppler signals in the dorsalis pedis and posterior tibial positions bilaterally.  RIGHT LOWER EXTREMITY VENOUS DUPLEX: His right lower extremity venous duplex scan shows no evidence of DVT. However, he does have reflux in his femoral vein, popliteal vein and right great saphenous vein.  MEDICAL ISSUES:  RIGHT CALF PAIN:  He has no evidence of arterial insufficiency. He has no evidence of DVT. His only significant finding is significant venous reflux in both the deep system and the great saphenous vein. We have discussed the importance of intermittent leg elevation in the proper positioning for this. I have also written him a prescription for knee-high compression stockings with a gradient of 15-20 mmHg. I've encouraged him to avoid prolonged sitting and standing. I'll see him back when he comes back for his regularly scheduled follow up visit of his right common iliac artery aneurysm. He knows to call sooner if he has problems.   Waverly Ferrari [facility] [place] [facility]: [phone]

## 2015-10-31 ENCOUNTER — Inpatient Hospital Stay (INDEPENDENT_AMBULATORY_CARE_PROVIDER_SITE_OTHER)
Admission: RE | Admit: 2015-10-31 | Discharge: 2015-10-31 | Disposition: A | Discharge status: Home / Self Care | Payer: Medicare Other | Source: Ambulatory Visit

## 2015-10-31 ENCOUNTER — Ambulatory Visit: Payer: Medicare Other | Admitting: Vascular Surgery

## 2015-10-31 DIAGNOSIS — M79604 Pain in right leg: Secondary | ICD-10-CM

## 2015-10-31 DIAGNOSIS — M79605 Pain in left leg: Secondary | ICD-10-CM

## 2016-01-17 ENCOUNTER — Telehealth: Payer: Self-pay

## 2016-01-17 NOTE — Telephone Encounter (Signed)
Attempted to return call to pt. In response to voice message left on Triage line about c/o pain in both legs.  Left voice message for pt. to call the office tomorrow to discuss sx's.

## 2016-01-18 NOTE — Telephone Encounter (Signed)
Able to contact the pt. To discuss his symptoms.  Reported he went for a 2nd opinion, @ another clinic, due to persistent pain in his bilateral legs.  Reported he is planning to follow up with that clinic next week to determine next step in treatment.   Advised to contact our office if needs further evaluation of his legs.  Reported he will continue to f/u with Dr. Edilia Boickson for his AAA.  Pt. Agreed.

## 2016-01-29 DIAGNOSIS — M25562 Pain in left knee: Secondary | ICD-10-CM | POA: Diagnosis not present

## 2016-01-29 DIAGNOSIS — M25561 Pain in right knee: Secondary | ICD-10-CM | POA: Diagnosis not present

## 2016-02-06 DIAGNOSIS — R7309 Other abnormal glucose: Secondary | ICD-10-CM | POA: Diagnosis not present

## 2016-02-06 DIAGNOSIS — Z1211 Encounter for screening for malignant neoplasm of colon: Secondary | ICD-10-CM | POA: Diagnosis not present

## 2016-02-06 DIAGNOSIS — E785 Hyperlipidemia, unspecified: Secondary | ICD-10-CM | POA: Diagnosis not present

## 2016-02-06 DIAGNOSIS — I10 Essential (primary) hypertension: Secondary | ICD-10-CM | POA: Diagnosis not present

## 2016-02-06 DIAGNOSIS — R972 Elevated prostate specific antigen [PSA]: Secondary | ICD-10-CM | POA: Diagnosis not present

## 2016-09-26 DIAGNOSIS — I10 Essential (primary) hypertension: Secondary | ICD-10-CM | POA: Diagnosis not present

## 2016-09-26 DIAGNOSIS — M7521 Bicipital tendinitis, right shoulder: Secondary | ICD-10-CM | POA: Diagnosis not present

## 2016-10-07 DIAGNOSIS — Z1212 Encounter for screening for malignant neoplasm of rectum: Secondary | ICD-10-CM | POA: Diagnosis not present

## 2016-10-07 DIAGNOSIS — Z1211 Encounter for screening for malignant neoplasm of colon: Secondary | ICD-10-CM | POA: Diagnosis not present

## 2016-10-09 DIAGNOSIS — M7521 Bicipital tendinitis, right shoulder: Secondary | ICD-10-CM | POA: Diagnosis not present

## 2016-10-09 DIAGNOSIS — I10 Essential (primary) hypertension: Secondary | ICD-10-CM | POA: Diagnosis not present

## 2016-10-09 DIAGNOSIS — K219 Gastro-esophageal reflux disease without esophagitis: Secondary | ICD-10-CM | POA: Diagnosis not present

## 2016-10-09 DIAGNOSIS — Z8679 Personal history of other diseases of the circulatory system: Secondary | ICD-10-CM | POA: Diagnosis not present

## 2016-10-09 DIAGNOSIS — Z0000 Encounter for general adult medical examination without abnormal findings: Secondary | ICD-10-CM | POA: Diagnosis not present

## 2016-10-09 DIAGNOSIS — F5102 Adjustment insomnia: Secondary | ICD-10-CM | POA: Diagnosis not present

## 2016-11-05 ENCOUNTER — Ambulatory Visit: Payer: Medicare Other | Admitting: Vascular Surgery

## 2016-11-05 ENCOUNTER — Other Ambulatory Visit (HOSPITAL_COMMUNITY): Payer: Medicare Other

## 2016-11-25 DIAGNOSIS — R300 Dysuria: Secondary | ICD-10-CM | POA: Diagnosis not present

## 2016-11-25 DIAGNOSIS — I10 Essential (primary) hypertension: Secondary | ICD-10-CM | POA: Diagnosis not present

## 2016-11-25 DIAGNOSIS — N410 Acute prostatitis: Secondary | ICD-10-CM | POA: Diagnosis not present

## 2016-12-03 ENCOUNTER — Other Ambulatory Visit (HOSPITAL_COMMUNITY): Payer: Medicare Other

## 2016-12-03 ENCOUNTER — Ambulatory Visit: Payer: Medicare Other | Admitting: Vascular Surgery

## 2016-12-24 ENCOUNTER — Encounter: Payer: Self-pay | Admitting: Vascular Surgery

## 2016-12-31 ENCOUNTER — Ambulatory Visit (HOSPITAL_COMMUNITY)
Admission: RE | Admit: 2016-12-31 | Discharge: 2016-12-31 | Disposition: A | Discharge status: Home / Self Care | Payer: Medicare Other | Source: Ambulatory Visit | Attending: Vascular Surgery | Admitting: Vascular Surgery

## 2016-12-31 ENCOUNTER — Ambulatory Visit (INDEPENDENT_AMBULATORY_CARE_PROVIDER_SITE_OTHER): Payer: Medicare Other | Admitting: Vascular Surgery

## 2016-12-31 ENCOUNTER — Encounter: Payer: Self-pay | Admitting: Vascular Surgery

## 2016-12-31 VITALS — BP 140/89 | HR 59 | Temp 97.4°F | Resp 20 | Ht 67.0 in | Wt 182.5 lb

## 2016-12-31 DIAGNOSIS — I714 Abdominal aortic aneurysm, without rupture, unspecified: Secondary | ICD-10-CM

## 2016-12-31 DIAGNOSIS — I723 Aneurysm of iliac artery: Secondary | ICD-10-CM | POA: Diagnosis not present

## 2016-12-31 NOTE — Progress Notes (Signed)
Patient name: Wesley Navarro MRN: [id number] DOB: Aug 24, 1946 Sex: male  REASON FOR VISIT: Follow up of right common iliac artery aneurysm.  HPI: Wesley Navarro is a 71 y.o. male who I have been following with a right common iliac artery aneurysm. In August 2016 this measured 1.7 cm in maximum diameter. I last saw the patient in February 2017. He comes in for a 1 year follow up visit.  Since I saw him last, he denies any history of abdominal pain or back pain. He is not a smoker. He is very active and still cuts yards.  Past Medical History:  Diagnosis Date  . AAA (abdominal aortic aneurysm) (HCC)   . Asthma   . BPH (benign prostatic hypertrophy)   . GERD (gastroesophageal reflux disease)   . Hypertension   . Iliac aneurysm (HCC)   . Iliac artery aneurysm (HCC)   . Sigmoid diverticulosis     Family History  Problem Relation Age of Onset  . Cancer Mother     malignant melanoma  . Diabetes Mother   . Hypertension Mother   . Heart disease Father   . Hypertension Father   . Other Father     varicose veins  . Heart attack Father   . Hypertension Sister     SOCIAL HISTORY: Social History  Substance Use Topics  . Smoking status: Former Smoker    Types: Cigarettes    Quit date: 04/27/1978  . Smokeless tobacco: Never Used  . Alcohol use No    No Known Allergies  Current Outpatient Prescriptions  Medication Sig Dispense Refill  . aspirin 81 MG tablet Take 81 mg by mouth daily.    . hydrochlorothiazide (HYDRODIURIL) 25 MG tablet Take 25 mg by mouth daily.    Marland Kitchen lisinopril (PRINIVIL,ZESTRIL) 10 MG tablet Take 10 mg by mouth daily.    Marland Kitchen lisinopril (PRINIVIL,ZESTRIL) 20 MG tablet Take 20 mg by mouth daily.    Marland Kitchen omeprazole (PRILOSEC) 20 MG capsule Take 20 mg by mouth daily.     No current facility-administered medications for this visit.     REVIEW OF SYSTEMS:   denotes positive finding,  denotes negative finding Cardiac  Comments:  Chest pain or chest  pressure:    Shortness of breath upon exertion:    Short of breath when lying flat:    Irregular heart rhythm:        Vascular    Pain in calf, thigh, or hip brought on by ambulation:    Pain in feet at night that wakes you up from your sleep:     Blood clot in your veins:    Leg swelling:         Pulmonary    Oxygen at home:    Productive cough:     Wheezing:         Neurologic    Sudden weakness in arms or legs:     Sudden numbness in arms or legs:     Sudden onset of difficulty speaking or slurred speech:    Temporary loss of vision in one eye:     Problems with dizziness:         Gastrointestinal    Blood in stool:     Vomited blood:         Genitourinary    Burning when urinating:     Blood in urine:        Psychiatric    Major depression:  Hematologic    Bleeding problems:    Problems with blood clotting too easily:        Skin    Rashes or ulcers:        Constitutional    Fever or chills:      PHYSICAL EXAM: Vitals:   12/31/16 0859  BP: 140/89  Pulse: (!) 59  Resp: 20  Temp: 97.4 F (36.3 C)  TempSrc: Oral  SpO2: 97%  Weight: 182 lb 8 oz (82.8 kg)  Height:  (1.702 m)    GENERAL: The patient is a well-nourished male, in no acute distress. The vital signs are documented above. CARDIAC: There is a regular rate and rhythm.  VASCULAR: I do not detect carotid bruits. He has palpable femoral and pedal pulses. PULMONARY: There is good air exchange bilaterally without wheezing or rales. ABDOMEN: Soft and non-tender with normal pitched bowel sounds. I cannot palpate his aneurysm. MUSCULOSKELETAL: There are no major deformities or cyanosis. NEUROLOGIC: No focal weakness or paresthesias are detected. SKIN: There are no ulcers or rashes noted. PSYCHIATRIC: The patient has a normal affect.  DATA:   DUPLEX ABDOMINAL AORTA: I have independently interpreted his duplex of the abdominal aorta. The maximum diameter of his infrarenal aorta is 2.6  cm. This has not changed. The maximum diameter of his right common iliac artery is 1.7 cm which has not changed in 1 year. The maximum diameter of his left common iliac artery is 1.3 cm.  MEDICAL ISSUES:  1.7 CM RIGHT COMMON ILIAC ARTERY ANEURYSM: This aneurysm has remained stable in size over the last year. I think we can stretch his follow up of 18 months. I have ordered a duplex in 18 months LC him back at that time. If remains stable at that point we can extend his follow up even further to 2 years. He is not a smoker. I'm encouraged him to stay as active as possible. LC him back in 18 months. He knows to call sooner if he has problems.  Waverly Ferrari [facility] [phone]

## 2017-01-01 NOTE — Addendum Note (Signed)
Addended by: Burton Apley A on: 01/01/2017 08:32 AM   Modules accepted: Orders

## 2017-02-11 DIAGNOSIS — N410 Acute prostatitis: Secondary | ICD-10-CM | POA: Diagnosis not present

## 2017-02-11 DIAGNOSIS — I10 Essential (primary) hypertension: Secondary | ICD-10-CM | POA: Diagnosis not present

## 2017-02-11 DIAGNOSIS — E782 Mixed hyperlipidemia: Secondary | ICD-10-CM | POA: Diagnosis not present

## 2017-03-18 DIAGNOSIS — I10 Essential (primary) hypertension: Secondary | ICD-10-CM | POA: Diagnosis not present

## 2017-03-18 DIAGNOSIS — N410 Acute prostatitis: Secondary | ICD-10-CM | POA: Diagnosis not present

## 2017-04-17 DIAGNOSIS — R1011 Right upper quadrant pain: Secondary | ICD-10-CM | POA: Diagnosis not present

## 2017-04-17 DIAGNOSIS — R509 Fever, unspecified: Secondary | ICD-10-CM | POA: Diagnosis not present

## 2017-04-17 DIAGNOSIS — Z79899 Other long term (current) drug therapy: Secondary | ICD-10-CM | POA: Diagnosis not present

## 2017-04-17 DIAGNOSIS — R110 Nausea: Secondary | ICD-10-CM | POA: Diagnosis not present

## 2017-04-17 DIAGNOSIS — R102 Pelvic and perineal pain: Secondary | ICD-10-CM | POA: Diagnosis not present

## 2017-04-17 DIAGNOSIS — N410 Acute prostatitis: Secondary | ICD-10-CM | POA: Diagnosis not present

## 2017-04-17 DIAGNOSIS — K5720 Diverticulitis of large intestine with perforation and abscess without bleeding: Secondary | ICD-10-CM | POA: Diagnosis not present

## 2017-04-17 DIAGNOSIS — K219 Gastro-esophageal reflux disease without esophagitis: Secondary | ICD-10-CM | POA: Diagnosis not present

## 2017-04-17 DIAGNOSIS — R160 Hepatomegaly, not elsewhere classified: Secondary | ICD-10-CM | POA: Diagnosis not present

## 2017-04-17 DIAGNOSIS — N200 Calculus of kidney: Secondary | ICD-10-CM | POA: Diagnosis not present

## 2017-04-17 DIAGNOSIS — Z7982 Long term (current) use of aspirin: Secondary | ICD-10-CM | POA: Diagnosis not present

## 2017-04-17 DIAGNOSIS — K449 Diaphragmatic hernia without obstruction or gangrene: Secondary | ICD-10-CM | POA: Diagnosis not present

## 2017-04-17 DIAGNOSIS — I10 Essential (primary) hypertension: Secondary | ICD-10-CM | POA: Diagnosis not present

## 2017-04-18 DIAGNOSIS — K5720 Diverticulitis of large intestine with perforation and abscess without bleeding: Secondary | ICD-10-CM | POA: Diagnosis not present

## 2017-04-19 DIAGNOSIS — K5720 Diverticulitis of large intestine with perforation and abscess without bleeding: Secondary | ICD-10-CM | POA: Diagnosis not present

## 2017-04-20 DIAGNOSIS — K5720 Diverticulitis of large intestine with perforation and abscess without bleeding: Secondary | ICD-10-CM | POA: Diagnosis not present

## 2017-04-21 DIAGNOSIS — K5720 Diverticulitis of large intestine with perforation and abscess without bleeding: Secondary | ICD-10-CM | POA: Diagnosis not present

## 2017-04-29 DIAGNOSIS — Z1211 Encounter for screening for malignant neoplasm of colon: Secondary | ICD-10-CM | POA: Diagnosis not present

## 2017-04-29 DIAGNOSIS — K5720 Diverticulitis of large intestine with perforation and abscess without bleeding: Secondary | ICD-10-CM | POA: Diagnosis not present

## 2017-04-29 DIAGNOSIS — I10 Essential (primary) hypertension: Secondary | ICD-10-CM | POA: Diagnosis not present

## 2017-04-30 DIAGNOSIS — N410 Acute prostatitis: Secondary | ICD-10-CM | POA: Diagnosis not present

## 2017-04-30 DIAGNOSIS — I10 Essential (primary) hypertension: Secondary | ICD-10-CM | POA: Diagnosis not present

## 2017-04-30 DIAGNOSIS — N210 Calculus in bladder: Secondary | ICD-10-CM | POA: Diagnosis not present

## 2017-05-05 DIAGNOSIS — K5720 Diverticulitis of large intestine with perforation and abscess without bleeding: Secondary | ICD-10-CM | POA: Diagnosis not present

## 2017-05-05 DIAGNOSIS — K760 Fatty (change of) liver, not elsewhere classified: Secondary | ICD-10-CM | POA: Diagnosis not present

## 2017-05-05 DIAGNOSIS — K7689 Other specified diseases of liver: Secondary | ICD-10-CM | POA: Diagnosis not present

## 2017-05-05 DIAGNOSIS — N281 Cyst of kidney, acquired: Secondary | ICD-10-CM | POA: Diagnosis not present

## 2017-05-14 DIAGNOSIS — K219 Gastro-esophageal reflux disease without esophagitis: Secondary | ICD-10-CM | POA: Diagnosis not present

## 2017-05-14 DIAGNOSIS — N411 Chronic prostatitis: Secondary | ICD-10-CM | POA: Diagnosis not present

## 2017-05-14 DIAGNOSIS — E782 Mixed hyperlipidemia: Secondary | ICD-10-CM | POA: Diagnosis not present

## 2017-05-14 DIAGNOSIS — I10 Essential (primary) hypertension: Secondary | ICD-10-CM | POA: Diagnosis not present

## 2017-05-21 DIAGNOSIS — K5792 Diverticulitis of intestine, part unspecified, without perforation or abscess without bleeding: Secondary | ICD-10-CM | POA: Diagnosis not present

## 2017-05-21 DIAGNOSIS — I10 Essential (primary) hypertension: Secondary | ICD-10-CM | POA: Diagnosis not present

## 2017-05-21 DIAGNOSIS — K5720 Diverticulitis of large intestine with perforation and abscess without bleeding: Secondary | ICD-10-CM | POA: Diagnosis not present

## 2017-06-04 DIAGNOSIS — K5732 Diverticulitis of large intestine without perforation or abscess without bleeding: Secondary | ICD-10-CM | POA: Diagnosis not present

## 2017-06-04 DIAGNOSIS — K5900 Constipation, unspecified: Secondary | ICD-10-CM | POA: Diagnosis not present

## 2017-06-11 DIAGNOSIS — I10 Essential (primary) hypertension: Secondary | ICD-10-CM | POA: Diagnosis not present

## 2017-06-11 DIAGNOSIS — K5730 Diverticulosis of large intestine without perforation or abscess without bleeding: Secondary | ICD-10-CM | POA: Diagnosis not present

## 2017-06-12 DIAGNOSIS — I10 Essential (primary) hypertension: Secondary | ICD-10-CM | POA: Diagnosis not present

## 2017-06-12 DIAGNOSIS — E782 Mixed hyperlipidemia: Secondary | ICD-10-CM | POA: Diagnosis not present

## 2017-06-12 DIAGNOSIS — K219 Gastro-esophageal reflux disease without esophagitis: Secondary | ICD-10-CM | POA: Diagnosis not present

## 2017-06-12 DIAGNOSIS — Z7982 Long term (current) use of aspirin: Secondary | ICD-10-CM | POA: Diagnosis not present

## 2017-06-12 DIAGNOSIS — Z79899 Other long term (current) drug therapy: Secondary | ICD-10-CM | POA: Diagnosis not present

## 2017-06-12 DIAGNOSIS — N210 Calculus in bladder: Secondary | ICD-10-CM | POA: Diagnosis not present

## 2017-06-12 DIAGNOSIS — Z87891 Personal history of nicotine dependence: Secondary | ICD-10-CM | POA: Diagnosis not present

## 2017-07-14 DIAGNOSIS — M79602 Pain in left arm: Secondary | ICD-10-CM | POA: Diagnosis not present

## 2017-07-14 DIAGNOSIS — F5101 Primary insomnia: Secondary | ICD-10-CM | POA: Diagnosis not present

## 2017-07-14 DIAGNOSIS — I10 Essential (primary) hypertension: Secondary | ICD-10-CM | POA: Diagnosis not present

## 2017-08-04 DIAGNOSIS — M19012 Primary osteoarthritis, left shoulder: Secondary | ICD-10-CM | POA: Diagnosis not present

## 2017-08-04 DIAGNOSIS — M25512 Pain in left shoulder: Secondary | ICD-10-CM | POA: Diagnosis not present

## 2017-08-10 DIAGNOSIS — I10 Essential (primary) hypertension: Secondary | ICD-10-CM | POA: Diagnosis not present

## 2017-08-10 DIAGNOSIS — N529 Male erectile dysfunction, unspecified: Secondary | ICD-10-CM | POA: Diagnosis not present

## 2017-08-10 DIAGNOSIS — N410 Acute prostatitis: Secondary | ICD-10-CM | POA: Diagnosis not present

## 2017-08-10 DIAGNOSIS — N419 Inflammatory disease of prostate, unspecified: Secondary | ICD-10-CM | POA: Diagnosis not present

## 2017-09-03 DIAGNOSIS — K219 Gastro-esophageal reflux disease without esophagitis: Secondary | ICD-10-CM | POA: Diagnosis not present

## 2017-09-03 DIAGNOSIS — I10 Essential (primary) hypertension: Secondary | ICD-10-CM | POA: Diagnosis not present

## 2017-09-03 DIAGNOSIS — K5720 Diverticulitis of large intestine with perforation and abscess without bleeding: Secondary | ICD-10-CM | POA: Diagnosis not present

## 2017-09-04 DIAGNOSIS — M79602 Pain in left arm: Secondary | ICD-10-CM | POA: Diagnosis not present

## 2017-09-04 DIAGNOSIS — I10 Essential (primary) hypertension: Secondary | ICD-10-CM | POA: Diagnosis not present

## 2017-09-04 DIAGNOSIS — F5101 Primary insomnia: Secondary | ICD-10-CM | POA: Diagnosis not present

## 2017-10-16 DIAGNOSIS — E782 Mixed hyperlipidemia: Secondary | ICD-10-CM | POA: Diagnosis not present

## 2017-10-16 DIAGNOSIS — I10 Essential (primary) hypertension: Secondary | ICD-10-CM | POA: Diagnosis not present

## 2017-10-16 DIAGNOSIS — L723 Sebaceous cyst: Secondary | ICD-10-CM | POA: Diagnosis not present

## 2017-10-16 DIAGNOSIS — Z0000 Encounter for general adult medical examination without abnormal findings: Secondary | ICD-10-CM | POA: Diagnosis not present

## 2018-04-15 DIAGNOSIS — E782 Mixed hyperlipidemia: Secondary | ICD-10-CM | POA: Diagnosis not present

## 2018-04-15 DIAGNOSIS — I10 Essential (primary) hypertension: Secondary | ICD-10-CM | POA: Diagnosis not present

## 2018-06-11 DIAGNOSIS — Z23 Encounter for immunization: Secondary | ICD-10-CM | POA: Diagnosis not present

## 2018-10-07 DIAGNOSIS — Z0000 Encounter for general adult medical examination without abnormal findings: Secondary | ICD-10-CM | POA: Diagnosis not present

## 2018-10-07 DIAGNOSIS — I10 Essential (primary) hypertension: Secondary | ICD-10-CM | POA: Diagnosis not present

## 2018-10-07 DIAGNOSIS — Z23 Encounter for immunization: Secondary | ICD-10-CM | POA: Diagnosis not present

## 2018-10-07 DIAGNOSIS — N401 Enlarged prostate with lower urinary tract symptoms: Secondary | ICD-10-CM | POA: Diagnosis not present

## 2019-06-16 DIAGNOSIS — Z23 Encounter for immunization: Secondary | ICD-10-CM | POA: Diagnosis not present

## 2019-06-16 DIAGNOSIS — L280 Lichen simplex chronicus: Secondary | ICD-10-CM | POA: Diagnosis not present
